# Patient Record
Sex: Male | Born: 1985 | Hispanic: Yes | Marital: Single | Smoking: Current every day smoker
Health system: Southern US, Community
[De-identification: ages and names within clinical notes are randomized; demographics above are authoritative.]

---

## 2010-07-05 ENCOUNTER — Emergency Department (HOSPITAL_COMMUNITY): Admission: EM | Admit: 2010-07-05 | Discharge: 2010-07-05 | Payer: Self-pay | Admitting: Family Medicine

## 2011-01-12 LAB — POCT URINALYSIS DIPSTICK
Bilirubin Urine: NEGATIVE
Glucose, UA: NEGATIVE mg/dL
Ketones, ur: NEGATIVE mg/dL
Specific Gravity, Urine: 1.01 (ref 1.005–1.030)

## 2015-03-06 ENCOUNTER — Emergency Department (HOSPITAL_COMMUNITY)
Admission: EM | Admit: 2015-03-06 | Discharge: 2015-03-06 | Disposition: A | Discharge status: Home / Self Care | Payer: Self-pay | Attending: Emergency Medicine | Admitting: Emergency Medicine

## 2015-03-06 ENCOUNTER — Encounter (HOSPITAL_COMMUNITY): Payer: Self-pay | Admitting: *Deleted

## 2015-03-06 DIAGNOSIS — L237 Allergic contact dermatitis due to plants, except food: Secondary | ICD-10-CM | POA: Insufficient documentation

## 2015-03-06 DIAGNOSIS — Z720 Tobacco use: Secondary | ICD-10-CM | POA: Insufficient documentation

## 2015-03-06 MED ORDER — TRIAMCINOLONE ACETONIDE 0.025 % EX OINT: 1.0000 "application " | TOPICAL_OINTMENT | Freq: Two times a day (BID) | CUTANEOUS | Status: AC

## 2015-03-06 MED ORDER — PREDNISONE 20 MG PO TABS: 40.0000 mg | ORAL_TABLET | Freq: Every day | ORAL | Status: AC

## 2015-03-06 MED ORDER — DEXAMETHASONE SODIUM PHOSPHATE 10 MG/ML IJ SOLN
10.0000 mg | Freq: Once | INTRAMUSCULAR | Status: AC
  Administered 2015-03-06: 10 mg via INTRAMUSCULAR
  Filled 2015-03-06: qty 1

## 2015-03-06 NOTE — ED Provider Notes (Signed)
CSN: [id number]     Arrival date & time 03/06/15  1636 History  This chart was scribed for a non-physician practitioner, Roxy Horsemanobert Syrah Daughtrey, PA-C working with Donnetta HutchingBrian Cook, MD by [place] Peace, ED Scribe. The patient was seen in TR05C/TR05C. The patient's care was started at 4:56 PM.    Chief Complaint  Patient presents with  . Rash      Patient is a 29 y.o. male presenting with rash. The history is provided by the patient. No language interpreter was used.  Rash Associated symptoms: no diarrhea, no fever, no nausea, no shortness of breath and not vomiting     HPI Comments: Gavin Blanchard is a 29 y.o. male who presents to the Emergency Department complaining of rash on upper and lower extremities 2 weeks. States that he originally noticed the rash after working in his yard. He believes to be poison ivy. He denies any involvement of his face or genitals. He denies any fevers, chills, nausea, or vomiting. He has tried several soaks and OTC meds with no relief. He denies any allergies to any medications. There are no aggravating or alleviating factors.   History reviewed. No pertinent past medical history. History reviewed. No pertinent past surgical history. No family history on file. History  Substance Use Topics  . Smoking status: Current Every Day Smoker  . Smokeless tobacco: Not on file  . Alcohol Use: No    Review of Systems  Constitutional: Negative for fever and chills.  Respiratory: Negative for shortness of breath.   Cardiovascular: Negative for chest pain.  Gastrointestinal: Negative for nausea, vomiting, diarrhea and constipation.  Genitourinary: Negative for dysuria.  Skin: Positive for rash.      Allergies  Review of patient's allergies indicates no known allergies.  Home Medications   Prior to Admission medications   Not on File   BP 146/79 mmHg  Pulse 76  Temp(Src) 97.6 F (36.4 C) (Oral)  Resp 14  SpO2 98% Physical Exam  Constitutional: He is  oriented to person, place, and time. He appears well-developed and well-nourished. No distress.  HENT:  Head: Normocephalic and atraumatic.  Eyes: Conjunctivae and EOM are normal.  Neck: Neck supple. No tracheal deviation present.  Cardiovascular: Normal rate.   Pulmonary/Chest: Effort normal. No respiratory distress.  Musculoskeletal: Normal range of motion.  Neurological: He is alert and oriented to person, place, and time.  Skin: Skin is warm and dry.  Contact dermatitis of anterior upper extremities, some mild to moderate involvement of the anterior torso, and mild to moderate involvement of the medial upper thighs, no large vesicles, no weeping or discharge at this time, no evidence of superimposed infection  Psychiatric: He has a normal mood and affect. His behavior is normal.  Nursing note and vitals reviewed.   ED Course  Procedures (including critical care time) Labs Review Labs Reviewed - No data to display  Imaging Review No results found.   EKG Interpretation None     Medications - No data to display  4:55 PM- Treatment plan was discussed with patient who verbalizes understanding and agrees.   MDM   Final diagnoses:  Poison ivy    Patient with poison ivy exposure. He has the rash on his arms and on his legs as well as on his trunk. Will give IM Decadron, and prednisone taper. Will also give triamcinolone cream. Return precautions discussed. Patient understands and agrees with the plan. He is stable and ready for discharge.  I personally performed the services described  in this documentation, which was scribed in my presence. The recorded information has been reviewed and is accurate.     Roxy Horsemanobert Eriel Doyon, PA-C 03/06/15 1721  Donnetta HutchingBrian Cook, MD 03/07/15 [phone]

## 2015-03-06 NOTE — ED Notes (Signed)
The pt has had an itching rash for 2 weeks.  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  The rash is all over his body

## 2015-03-06 NOTE — Discharge Instructions (Signed)

## 2019-05-20 ENCOUNTER — Other Ambulatory Visit: Payer: Self-pay | Admitting: *Deleted

## 2019-05-20 DIAGNOSIS — Z20822 Contact with and (suspected) exposure to covid-19: Secondary | ICD-10-CM

## 2019-05-25 LAB — NOVEL CORONAVIRUS, NAA: SARS-CoV-2, NAA: DETECTED — AB

## 2019-05-26 ENCOUNTER — Telehealth: Payer: Self-pay | Admitting: Critical Care Medicine

## 2019-05-26 NOTE — Telephone Encounter (Signed)
I connected with this patient who is COVID positive.  His test was positive from July 22.  He states that he had just minimal loss of taste and smell and a headache.  The patient understands he needs to stay in isolation for 10 days from onset of symptoms which were this patient with taken through August 1.  He is unclear as to his contacts would have been.  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

## 2019-06-19 ENCOUNTER — Emergency Department (HOSPITAL_COMMUNITY)
Admission: EM | Admit: 2019-06-19 | Discharge: 2019-06-19 | Disposition: A | Discharge status: Home / Self Care | Payer: Self-pay | Attending: Emergency Medicine | Admitting: Emergency Medicine

## 2019-06-19 ENCOUNTER — Other Ambulatory Visit: Payer: Self-pay

## 2019-06-19 ENCOUNTER — Emergency Department (HOSPITAL_COMMUNITY): Payer: Self-pay

## 2019-06-19 DIAGNOSIS — R0789 Other chest pain: Secondary | ICD-10-CM | POA: Insufficient documentation

## 2019-06-19 DIAGNOSIS — Z8619 Personal history of other infectious and parasitic diseases: Secondary | ICD-10-CM | POA: Insufficient documentation

## 2019-06-19 DIAGNOSIS — R51 Headache: Secondary | ICD-10-CM | POA: Insufficient documentation

## 2019-06-19 DIAGNOSIS — F17210 Nicotine dependence, cigarettes, uncomplicated: Secondary | ICD-10-CM | POA: Insufficient documentation

## 2019-06-19 DIAGNOSIS — Z20828 Contact with and (suspected) exposure to other viral communicable diseases: Secondary | ICD-10-CM | POA: Insufficient documentation

## 2019-06-19 DIAGNOSIS — R519 Headache, unspecified: Secondary | ICD-10-CM

## 2019-06-19 LAB — D-DIMER, QUANTITATIVE (NOT AT ARMC): D-Dimer, Quant: 0.27 ug/mL-FEU (ref 0.00–0.50)

## 2019-06-19 LAB — BASIC METABOLIC PANEL
Anion gap: 10 (ref 5–15)
BUN: 9 mg/dL (ref 6–20)
CO2: 26 mmol/L (ref 22–32)
Calcium: 9.4 mg/dL (ref 8.9–10.3)
Chloride: 101 mmol/L (ref 98–111)
Creatinine, Ser: 0.86 mg/dL (ref 0.61–1.24)
GFR calc Af Amer: >60 mL/min (ref >60)
GFR calc non Af Amer: >60 mL/min (ref >60)
Glucose, Bld: 106 mg/dL — ABNORMAL HIGH (ref 70–99)
Potassium: 3.8 mmol/L (ref 3.5–5.1)
Sodium: 137 mmol/L (ref 135–145)

## 2019-06-19 LAB — CBC
HCT: 46.3 % (ref 39.0–52.0)
Hemoglobin: 15.7 g/dL (ref 13.0–17.0)
MCH: 27.7 pg (ref 26.0–34.0)
MCHC: 33.9 g/dL (ref 30.0–36.0)
MCV: 81.7 fL (ref 80.0–100.0)
Platelets: 261 10*3/uL (ref 150–400)
RBC: 5.67 MIL/uL (ref 4.22–5.81)
RDW: 13.3 % (ref 11.5–15.5)
WBC: 6.5 10*3/uL (ref 4.0–10.5)
nRBC: 0 % (ref 0.0–0.2)

## 2019-06-19 LAB — TROPONIN I (HIGH SENSITIVITY)
Troponin I (High Sensitivity): 2 ng/L (ref <18)
Troponin I (High Sensitivity): 2 ng/L (ref <18)

## 2019-06-19 MED ORDER — DIPHENHYDRAMINE HCL 50 MG/ML IJ SOLN
12.5000 mg | Freq: Once | INTRAMUSCULAR | Status: AC
  Administered 2019-06-19: 12.5 mg via INTRAVENOUS
  Filled 2019-06-19: qty 1

## 2019-06-19 MED ORDER — SODIUM CHLORIDE 0.9 % IV SOLN: INTRAVENOUS | Status: DC

## 2019-06-19 MED ORDER — METOCLOPRAMIDE HCL 5 MG/ML IJ SOLN
10.0000 mg | Freq: Once | INTRAMUSCULAR | Status: AC
  Administered 2019-06-19: 19:00:00 10 mg via INTRAVENOUS
  Filled 2019-06-19: qty 2

## 2019-06-19 MED ORDER — IBUPROFEN 600 MG PO TABS: 600.0000 mg | ORAL_TABLET | Freq: Four times a day (QID) | ORAL | 0 refills | Status: AC | PRN

## 2019-06-19 MED ORDER — KETOROLAC TROMETHAMINE 15 MG/ML IJ SOLN
15.0000 mg | Freq: Once | INTRAMUSCULAR | Status: AC
  Administered 2019-06-19: 15 mg via INTRAVENOUS
  Filled 2019-06-19: qty 1

## 2019-06-19 MED ORDER — SODIUM CHLORIDE 0.9 % IV BOLUS
1000.0000 mL | Freq: Once | INTRAVENOUS | Status: AC
  Administered 2019-06-19: 1000 mL via INTRAVENOUS

## 2019-06-19 MED ORDER — SODIUM CHLORIDE 0.9% FLUSH
3.0000 mL | Freq: Once | INTRAVENOUS | Status: AC
  Administered 2019-06-19: 19:00:00 3 mL via INTRAVENOUS

## 2019-06-19 NOTE — ED Triage Notes (Addendum)
Patient reports intermittent L-sided chest pain x 1 week, describes as pressure and worse with laying down flat. Denies shortness of breath, dizziness, N/V. Also endorsing intermittent headaches unrelieved by Tylenol over the past week as well. Resp e/u, skin w/d.   Positive COVID test one month ago.

## 2019-06-19 NOTE — ED Provider Notes (Addendum)
[facility] Provider Note   CSN: [id number] Arrival date & time: 06/19/19  1621     History   Chief Complaint Chief Complaint  Patient presents with   Chest Pain    HPI Gavin HeckleRuben Blanchard is a 33 y.o. male.     HPI   Pt is a 33 y/o male who presents to the ED today for eval of chest pain. Pain located diffusely to the chest. Pain rated 7-8/10. States that pain has been present for the last week. He states pain is intermittent. Pain is worse when he lays down on his side or when he lays on his back.   Pt no longer has a cough. He has some pain with inspiration. He denies SOB.   He reports a headache that started 4 days ago. It started gradually and has worsened. Rates pain 9/10. Pain is intermittent. Pain located to to the top of his head and is behind his bilat eyes. Denies visual changes, speech problems, numbness, weakness. He has taken tylenol for headaches which improves symptoms temporarily.   Denies leg pain/swelling, hemoptysis, recent surgery, recent long travel, hormone use, personal hx of cancer, or hx of DVT/PE.   Denies tobacco use. No early family hx of heart disease. Denies h/o HTN, HLD, or DM.   He was diagnosed with COVID 1 month ago and had sxs for a week but he states sxs have improved.   No past medical history on file.  There are no active problems to display for this patient.   No past surgical history on file.      Home Medications    Prior to Admission medications   Medication Sig Start Date End Date Taking? Authorizing Provider  acetaminophen (TYLENOL) 325 MG tablet Take 650 mg by mouth every 6 (six) hours as needed for mild pain or headache.   Yes [provider]  predniSONE (DELTASONE) 20 MG tablet Take 2 tablets (40 mg total) by mouth daily. Take 40 mg by mouth daily for 5 days, then 20mg  by mouth daily for 5 days, then 10mg  (1/2 tablet) daily for 5 days Patient not taking: Reported on  06/19/2019 03/06/15   Roxy HorsemanBrowning, Robert, PA-C  triamcinolone (KENALOG) 0.025 % ointment Apply 1 application topically 2 (two) times daily. Do not apply to face Patient not taking: Reported on 06/19/2019 03/06/15   Roxy HorsemanBrowning, Robert, PA-C    Family History No family history on file.  Social History Social History   Tobacco Use   Smoking status: Current Every Day Smoker  Substance Use Topics   Alcohol use: No   Drug use: Not on file     Allergies   Patient has no known allergies.   Review of Systems Review of Systems  Constitutional: Negative for fever.  HENT: Negative for ear pain and sore throat.   Eyes: Negative for visual disturbance.  Respiratory: Negative for cough and shortness of breath.   Cardiovascular: Positive for chest pain. Negative for palpitations and leg swelling.  Gastrointestinal: Negative for abdominal pain, constipation, diarrhea, nausea and vomiting.  Genitourinary: Negative for dysuria and hematuria.  Musculoskeletal: Positive for back pain and myalgias.  Skin: Negative for rash.  Neurological: Positive for headaches. Negative for seizures, speech difficulty, weakness, light-headedness and numbness.  All other systems reviewed and are negative.    Physical Exam Updated Vital Signs BP 125/72    Pulse 73    Temp 98.3 F (36.8 C) (Oral)    Resp (!) 27  SpO2 97%   Physical Exam Vitals signs and nursing note reviewed.  Constitutional:      General: He is not in acute distress.    Appearance: He is well-developed. He is not ill-appearing or toxic-appearing.  HENT:     Head: Normocephalic and atraumatic.     Mouth/Throat:     Mouth: Mucous membranes are dry.  Eyes:     Extraocular Movements: Extraocular movements intact.     Conjunctiva/sclera: Conjunctivae normal.     Pupils: Pupils are equal, round, and reactive to light.  Neck:     Musculoskeletal: Neck supple.  Cardiovascular:     Rate and Rhythm: Normal rate and regular rhythm.     Pulses:  Normal pulses.     Heart sounds: Normal heart sounds. No murmur.  Pulmonary:     Effort: Pulmonary effort is normal. No respiratory distress.     Breath sounds: Normal breath sounds. No decreased breath sounds, wheezing, rhonchi or rales.  Abdominal:     General: Bowel sounds are normal.     Palpations: Abdomen is soft.     Tenderness: There is no abdominal tenderness.  Musculoskeletal:     Right lower leg: He exhibits no tenderness. No edema.     Left lower leg: He exhibits no tenderness. No edema.  Skin:    General: Skin is warm and dry.  Neurological:     Mental Status: He is alert.     Comments: Mental Status:  Alert, thought content appropriate, able to give a coherent history. Speech fluent without evidence of aphasia. Able to follow 2 step commands without difficulty.  Cranial Nerves:  II:  Peripheral visual fields grossly normal, pupils equal, round, reactive to light III,IV, VI: ptosis not present, extra-ocular motions intact bilaterally  V,VII: smile symmetric, facial light touch sensation equal VIII: hearing grossly normal to voice  X: uvula elevates symmetrically  XI: bilateral shoulder shrug symmetric and strong XII: midline tongue extension without fassiculations Motor:  Normal tone. 5/5 strength of BUE and BLE major muscle groups including strong and equal grip strength and dorsiflexion/plantar flexion Sensory: light touch normal in all extremities. Cerebellar: normal finger-to-nose with bilateral upper extremities Gait: normal gait and balance. Able to walk on toes and heels with ease.        ED Treatments / Results  Labs (all labs ordered are listed, but only abnormal results are displayed) Labs Reviewed  BASIC METABOLIC PANEL - Abnormal; Notable for the following components:      Result Value   Glucose, Bld 106 (*)    All other components within normal limits  NOVEL CORONAVIRUS, NAA (HOSPITAL ORDER, SEND-OUT TO REF LAB)  CBC  D-DIMER, QUANTITATIVE (NOT  [facility])  TROPONIN I (HIGH SENSITIVITY)  TROPONIN I (HIGH SENSITIVITY)    EKG None  EKG: normal EKG, normal sinus rhythm, NSR HR 77.   Radiology No results found.  Procedures Procedures (including critical care time)  Medications Ordered in ED Medications  sodium chloride flush (NS) 0.9 % injection 3 mL (3 mLs Intravenous Given 06/19/19 1834)  sodium chloride 0.9 % bolus 1,000 mL (0 mLs Intravenous Stopped 06/19/19 2009)  ketorolac (TORADOL) 15 MG/ML injection 15 mg (15 mg Intravenous Given 06/19/19 1832)  metoCLOPramide (REGLAN) injection 10 mg (10 mg Intravenous Given 06/19/19 1830)  diphenhydrAMINE (BENADRYL) injection 12.5 mg (12.5 mg Intravenous Given 06/19/19 1828)     Initial Impression / Assessment and Plan / ED Course  I have reviewed the triage vital signs and  the nursing notes.  Pertinent labs & imaging results that were available during my care of the patient were reviewed by me and considered in my medical decision making (see chart for details).     Final Clinical Impressions(s) / ED Diagnoses   Final diagnoses:  Atypical chest pain  Nonintractable headache, unspecified chronicity pattern, unspecified headache type    33 year old male with no known past medical history presenting for evaluation of chest pain and intermittent headaches.  Chest pain present intermittently for the last week.  No associated shortness of breath, cough or fever.  No known risk factors for ACS and symptoms seem atypical.  He was recently diagnosed with the coronavirus and due to concern for possible prothrombotic element of COVID, d-dimer was obtained which was negative.  Delta troponin I negative x2.  Chest x-ray was normal.  EKG was personally reviewed and shows normal sinus rhythm without ischemic changes.  I suspect his symptoms may be related to pleurisy versus MSK cause, will treat with anti-inflammatories.  With regard to headache, pt with headache intermittently over the last 4  days.  Started gradually and is worsened since onset.  No red flag signs or symptoms associated with headache.  HA treated and improved while in ED.  Presentation is non concerning for [facility], ICH, Meningitis, or temporal arteritis. Pt is afebrile with no focal neuro deficits, nuchal rigidity, or change in vision. Pt is to follow up with PCP to discuss further evaluation if headache persists.  He is also advised to follow-up in regards to his chest pain.  Pt verbalizes understanding and is agreeable with plan to dc.    ----------  Eliberto Ivory was evaluated in Emergency Department on 07/01/2019 for the symptoms described in the history of present illness. He was evaluated in the context of the global COVID-19 pandemic, which necessitated consideration that the patient might be at risk for infection with the SARS-CoV-2 virus that causes COVID-19. Institutional protocols and algorithms that pertain to the evaluation of patients at risk for COVID-19 are in a state of rapid change based on information released by regulatory bodies including the CDC and federal and state organizations. These policies and algorithms were followed during the patient's care in the ED.   ED Discharge Orders         Ordered    ibuprofen (ADVIL) 600 MG tablet  Every 6 hours PRN     06/19/19 [address] 06/19/19 2025    Blanchie Dessert, MD 06/21/19 2036    Rodney Booze, PA-C 07/01/19 1513    Blanchie Dessert, MD 07/02/19 [phone]

## 2019-06-19 NOTE — Discharge Instructions (Signed)

## 2019-06-21 LAB — NOVEL CORONAVIRUS, NAA (HOSP ORDER, SEND-OUT TO REF LAB; TAT 18-24 HRS): SARS-CoV-2, NAA: NOT DETECTED

## 2019-11-24 IMAGING — DX CHEST - 2 VIEW
2 series · 2 of 2 positions shown · non-contrast
Comparison: None.

CLINICAL DATA: Chest pain

EXAM:
CHEST - 2 VIEW

[w chest pa]
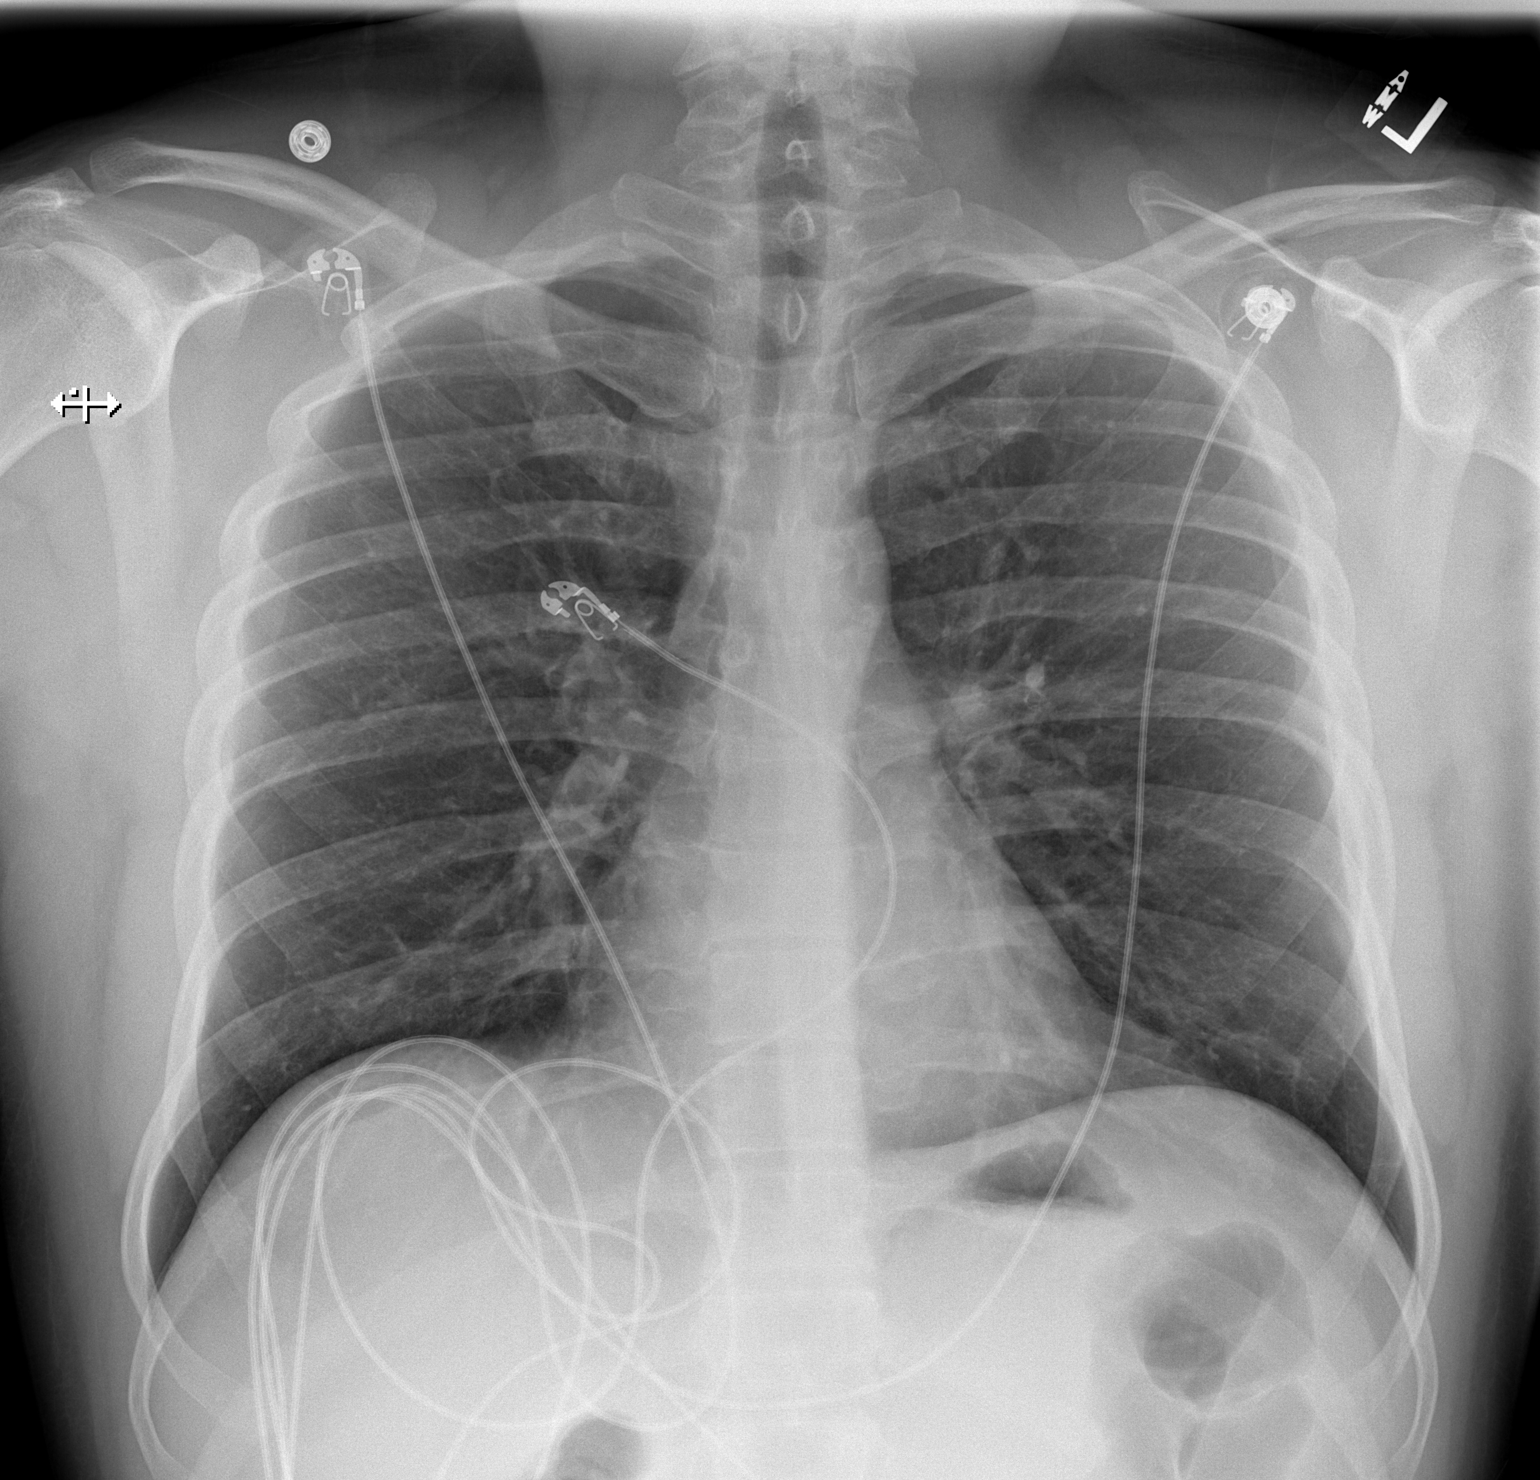

[w chest lat]
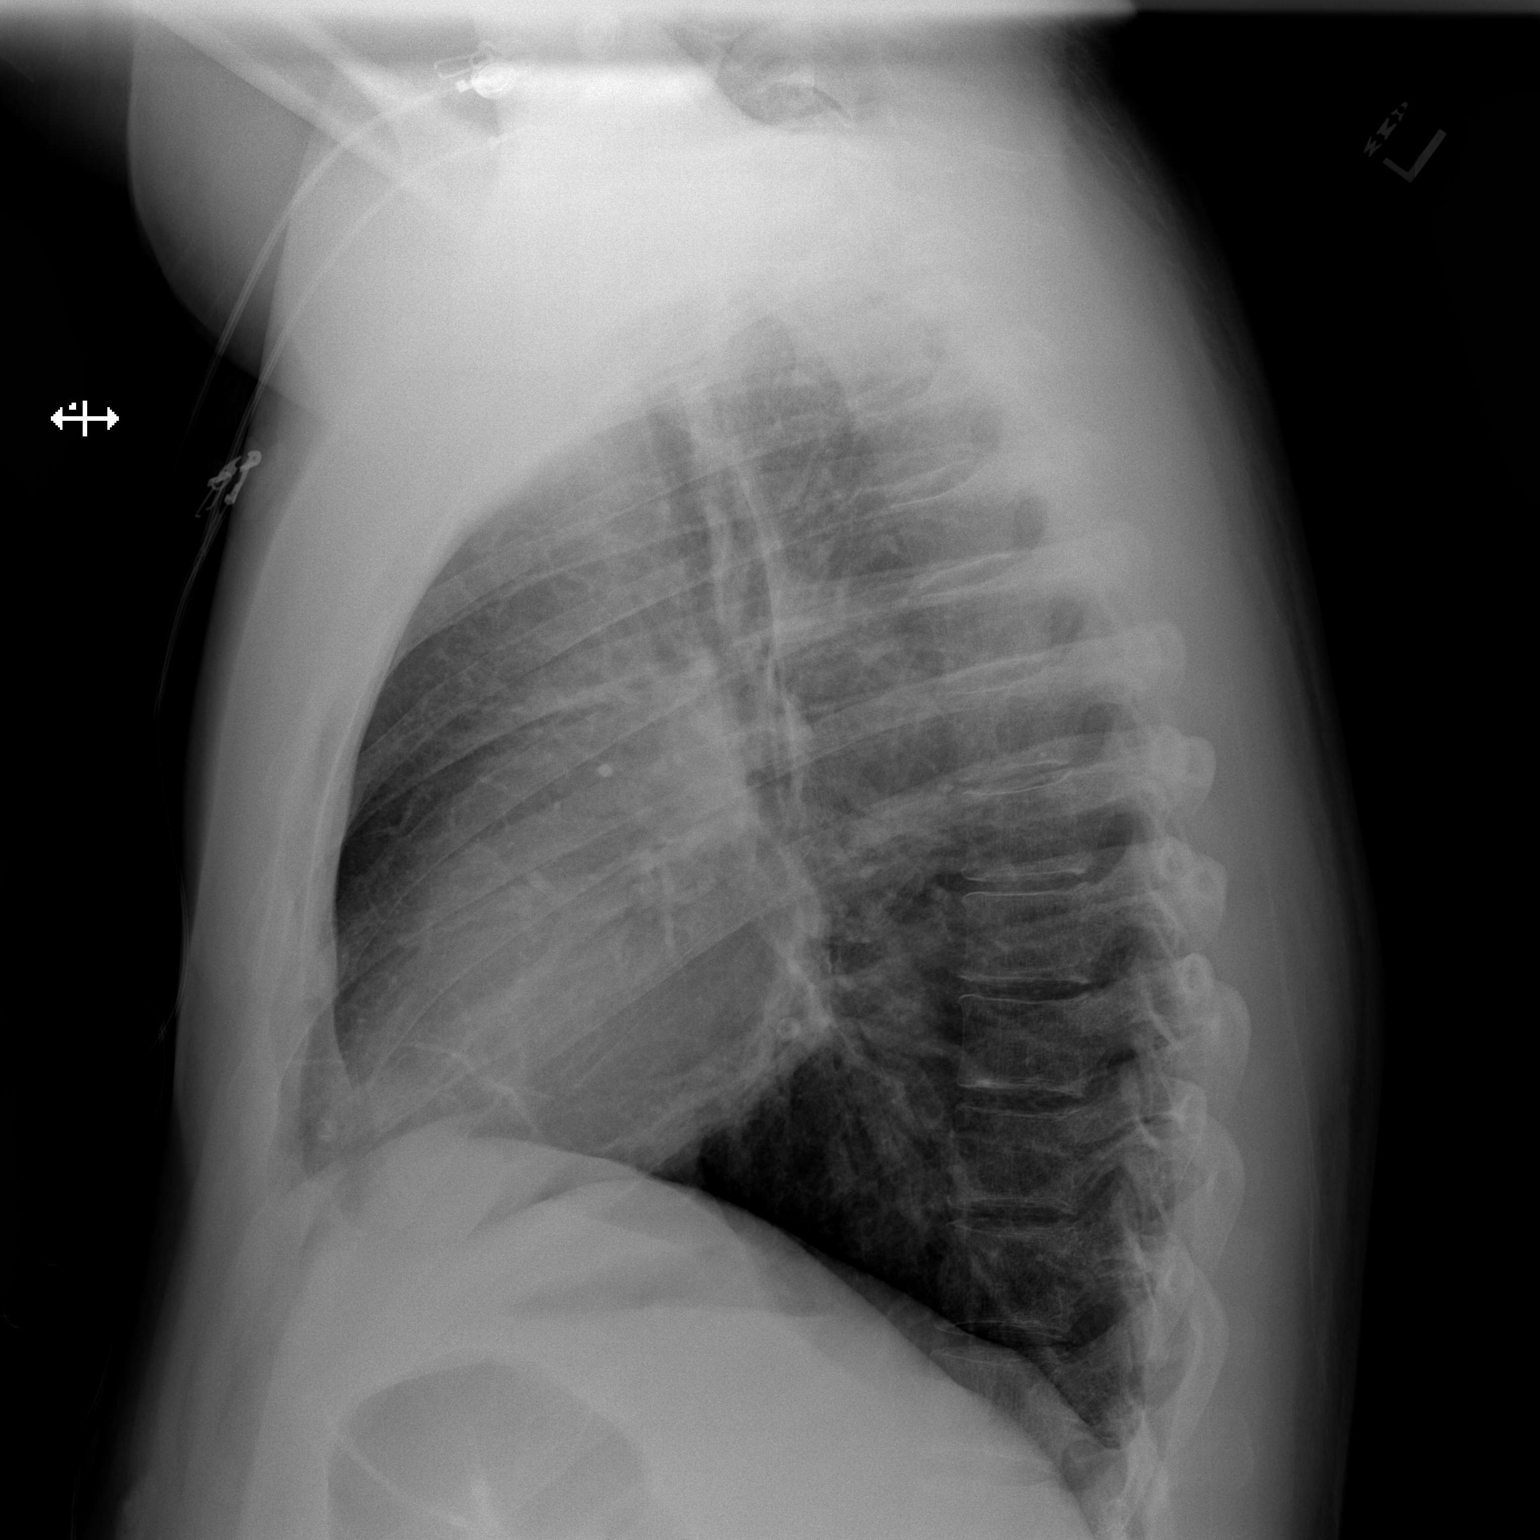

[2 of 2 positions shown; findings below may reference images not displayed]

FINDINGS: The heart size and mediastinal contours are within normal limits.
Both lungs are clear. The visualized skeletal structures are
unremarkable.
IMPRESSION: No active cardiopulmonary disease.
